# Patient Record
Sex: Female | Born: 1960 | Race: White | Hispanic: No | Marital: Married | Smoking: Current every day smoker
Health system: Southern US, Community
[De-identification: ages and names within clinical notes are randomized; demographics above are authoritative.]

---

## 2011-04-10 ENCOUNTER — Other Ambulatory Visit: Payer: Self-pay | Admitting: Orthopedic Surgery

## 2011-04-10 ENCOUNTER — Ambulatory Visit
Admission: RE | Admit: 2011-04-10 | Discharge: 2011-04-10 | Disposition: A | Discharge status: Home / Self Care | Payer: Medicaid Other | Source: Ambulatory Visit | Attending: Orthopedic Surgery | Admitting: Orthopedic Surgery

## 2011-04-10 DIAGNOSIS — M6740 Ganglion, unspecified site: Secondary | ICD-10-CM

## 2013-04-09 IMAGING — CR DG ELBOW 2V*R*
2 series · 2 of 2 positions shown · non-contrast
Comparison: None.

CLINICAL DATA: Right elbow pain

RIGHT ELBOW - 2 VIEW

[view not recorded (1 of 2)]
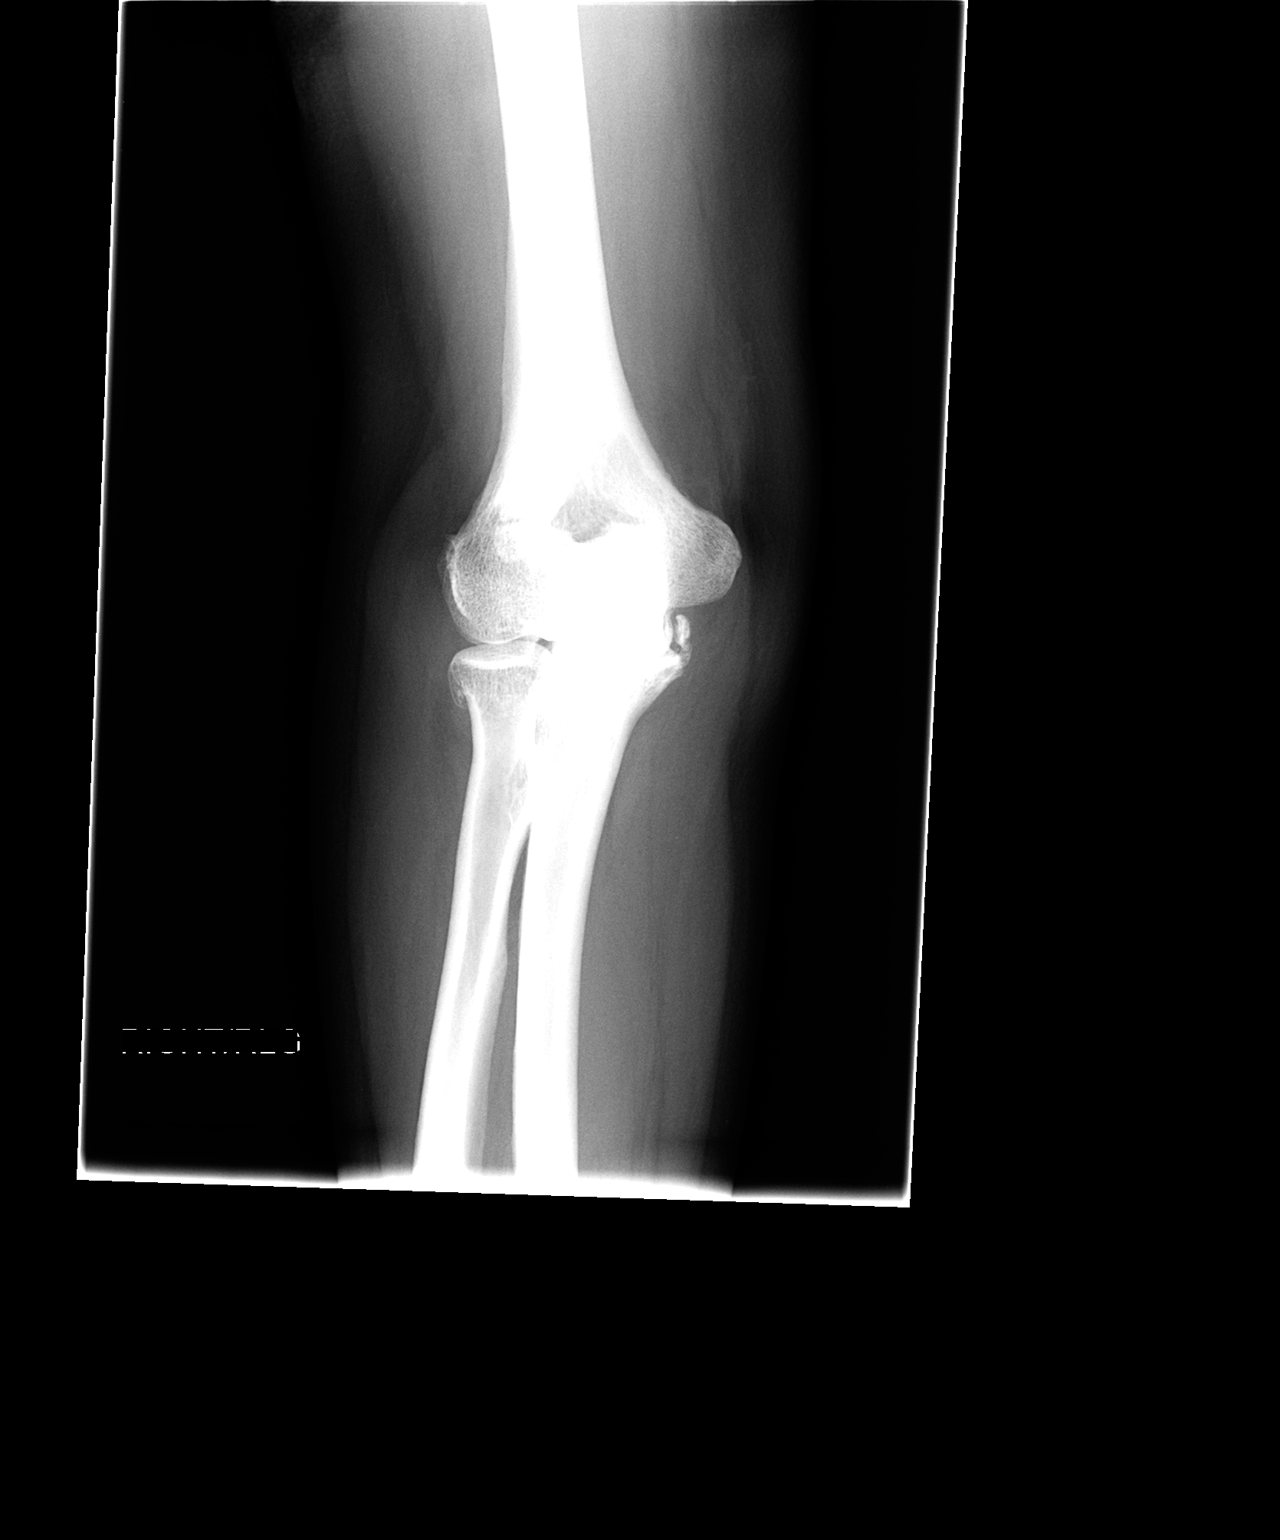

[view not recorded (2 of 2)]
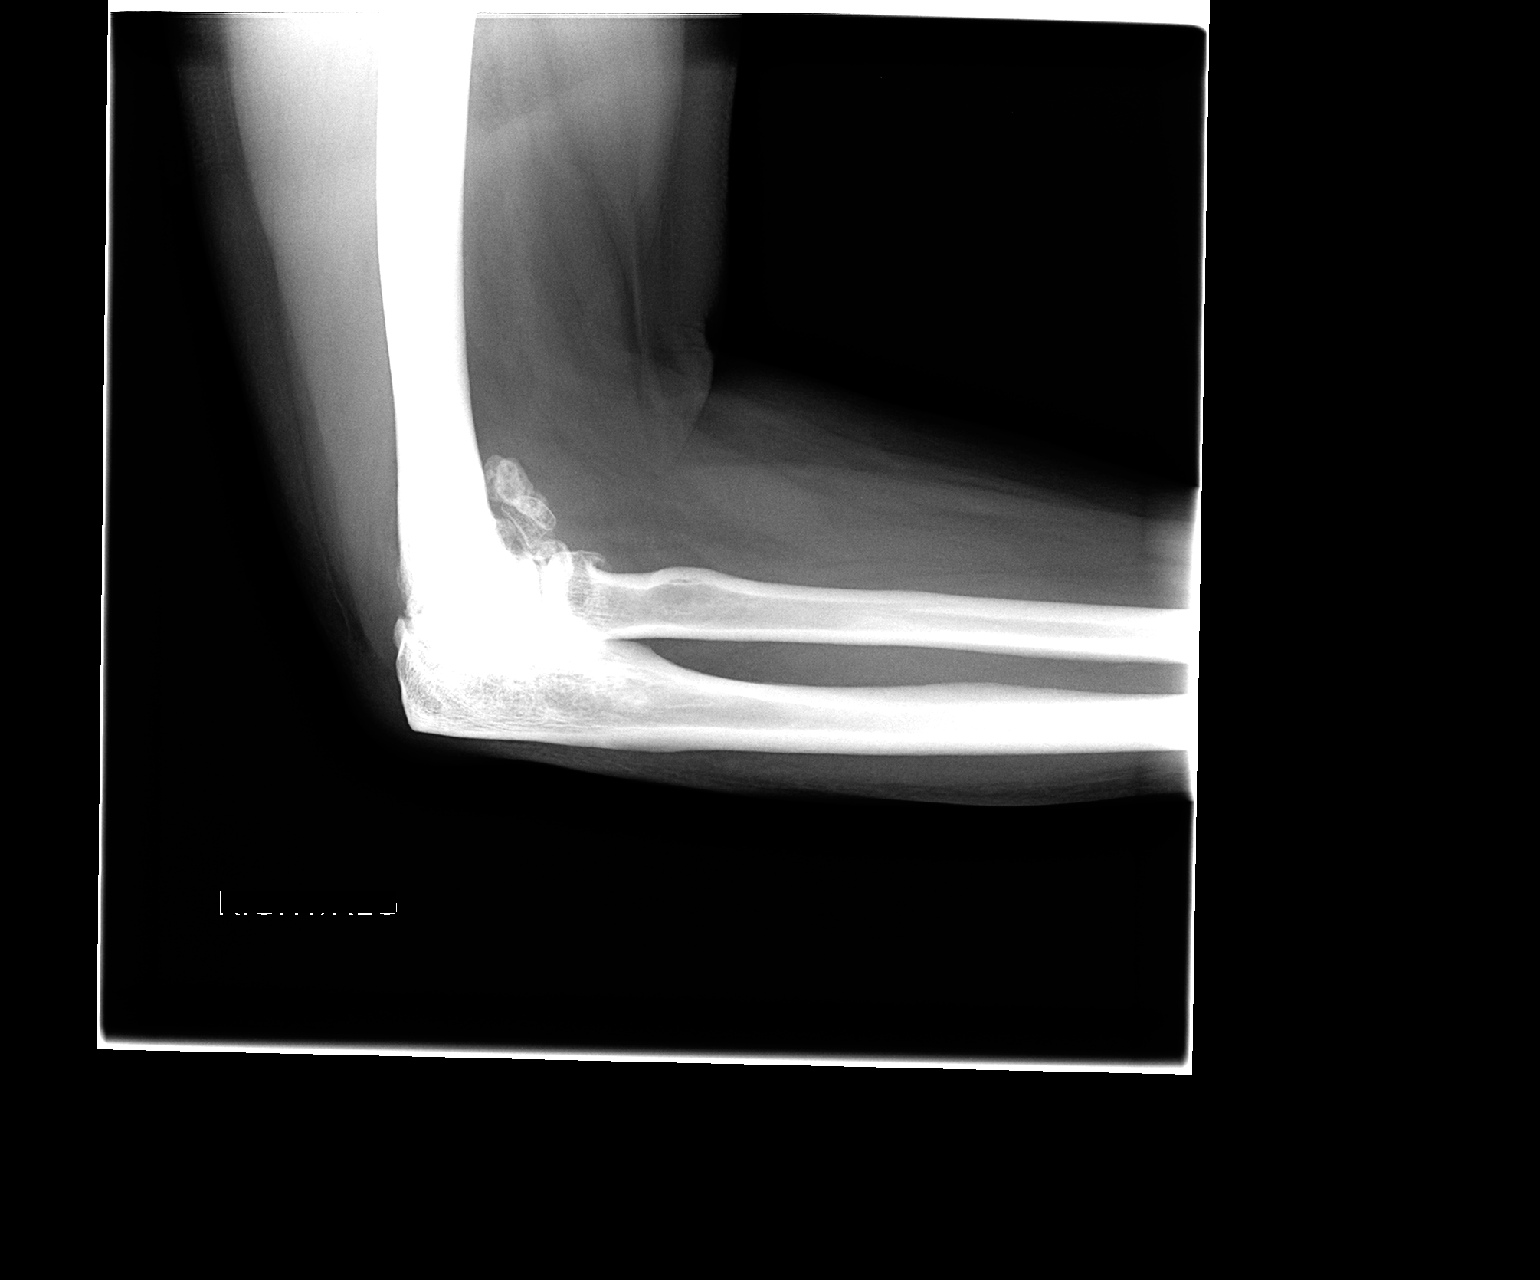

[2 of 2 positions shown; findings below may reference images not displayed]

FINDINGS: There is significant degenerative change involving the
right elbow with loss of joint space and sclerosis with spurring.
Also there appear to be loose bodies within the joint space
particularly anteriorly.  Alignment is normal.
IMPRESSION: Significant degenerative change with apparent loose bodies within
the right elbow joint space.

## 2013-04-09 IMAGING — CR DG WRIST 2V*L*
1 series · 1 of 1 positions shown · non-contrast
Comparison: None.

CLINICAL DATA: Left wrist pain

LEFT WRIST - 2 VIEW

[view not recorded]
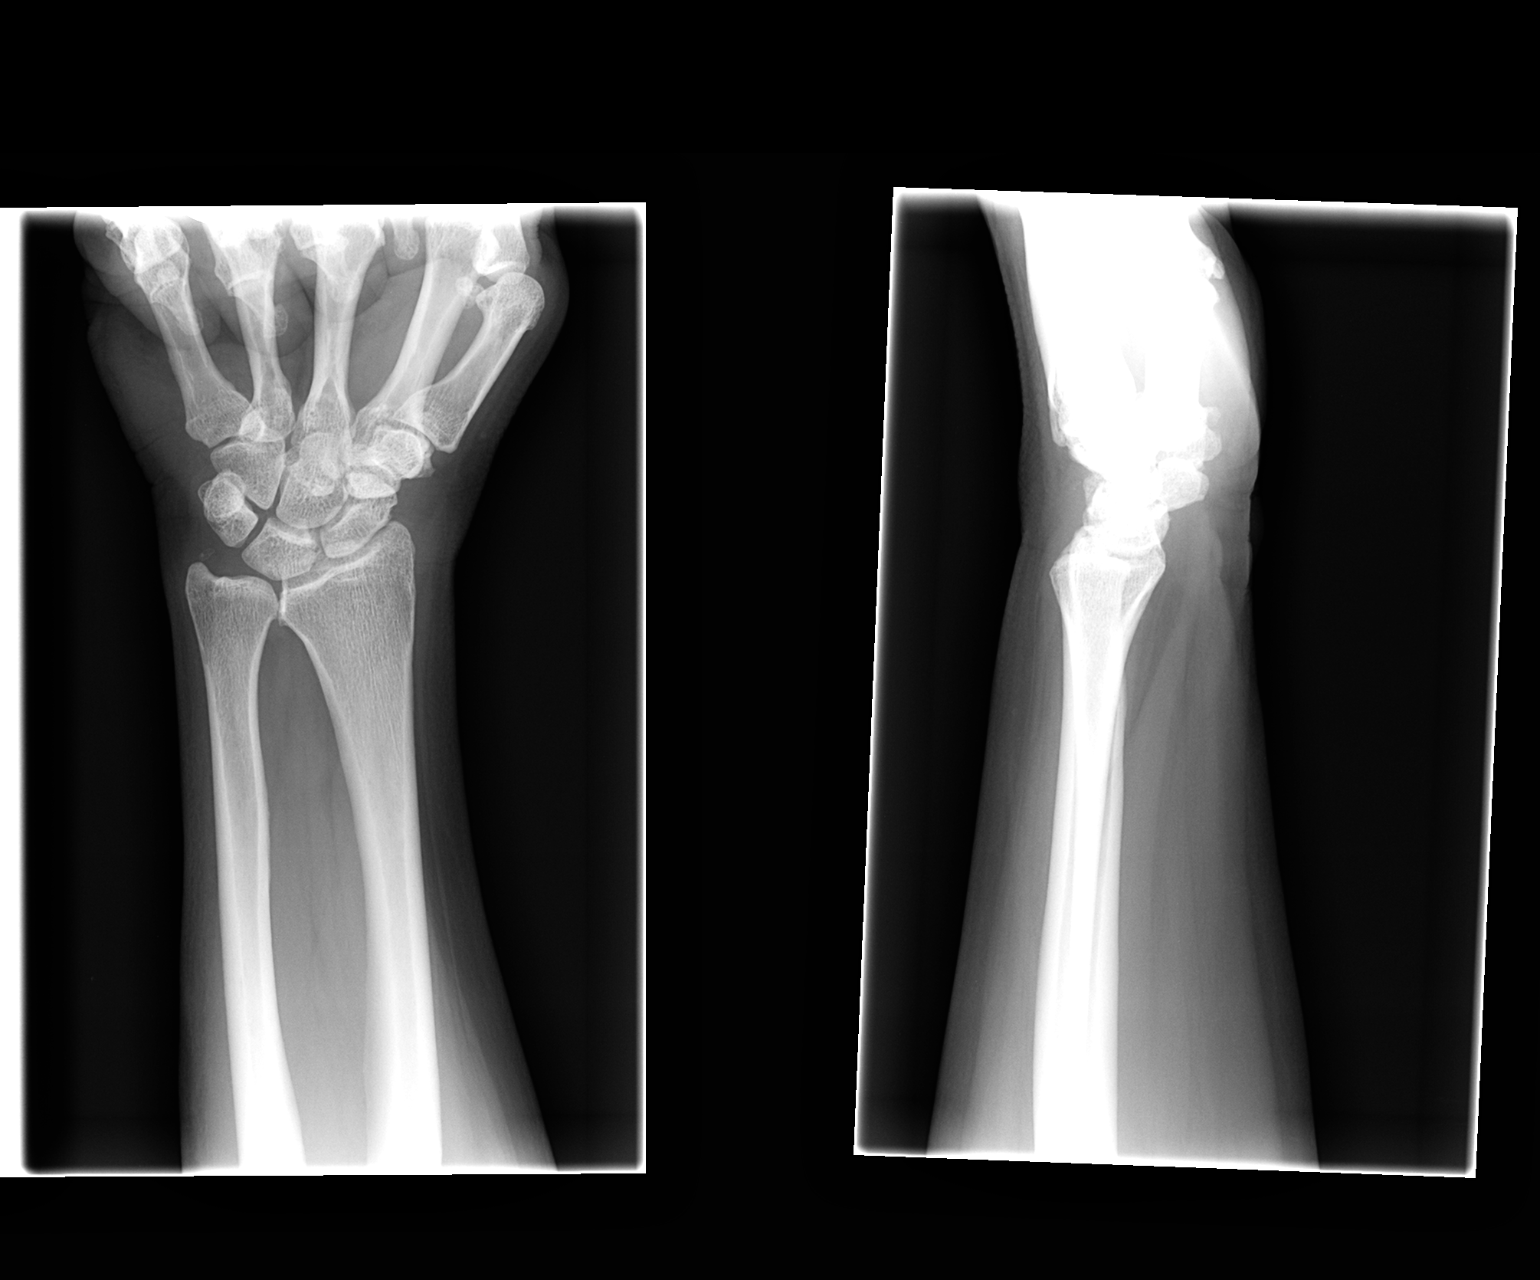

[1 of 1 positions shown; findings below may reference images not displayed]

FINDINGS: Radiocarpal joint space appears normal.  The carpal bones
are in normal position.  There is a very small bony density
adjacent to the ulnar styloid which may be related to prior trauma
but does not appear to represent an acute process.  Intercarpal
joint spaces are normal.  Alignment is normal.
IMPRESSION: No acute abnormality.

## 2014-11-05 DIAGNOSIS — E538 Deficiency of other specified B group vitamins: Secondary | ICD-10-CM | POA: Insufficient documentation

## 2014-11-05 DIAGNOSIS — E785 Hyperlipidemia, unspecified: Secondary | ICD-10-CM | POA: Insufficient documentation

## 2014-11-05 DIAGNOSIS — G8929 Other chronic pain: Secondary | ICD-10-CM | POA: Insufficient documentation

## 2014-11-05 DIAGNOSIS — I10 Essential (primary) hypertension: Secondary | ICD-10-CM | POA: Insufficient documentation

## 2014-11-05 DIAGNOSIS — M5441 Lumbago with sciatica, right side: Secondary | ICD-10-CM

## 2015-07-05 DIAGNOSIS — G959 Disease of spinal cord, unspecified: Secondary | ICD-10-CM | POA: Insufficient documentation

## 2016-04-14 DIAGNOSIS — F411 Generalized anxiety disorder: Secondary | ICD-10-CM | POA: Insufficient documentation

## 2016-04-14 DIAGNOSIS — F339 Major depressive disorder, recurrent, unspecified: Secondary | ICD-10-CM | POA: Insufficient documentation

## 2016-06-04 ENCOUNTER — Encounter (HOSPITAL_COMMUNITY): Payer: Self-pay | Admitting: Psychiatry

## 2016-06-04 ENCOUNTER — Ambulatory Visit (INDEPENDENT_AMBULATORY_CARE_PROVIDER_SITE_OTHER): Payer: Medicare HMO | Admitting: Psychiatry

## 2016-06-04 VITALS — BP 108/68 | HR 60 | Resp 16 | Ht 59.0 in | Wt 113.0 lb

## 2016-06-04 DIAGNOSIS — F0630 Mood disorder due to known physiological condition, unspecified: Secondary | ICD-10-CM

## 2016-06-04 DIAGNOSIS — F339 Major depressive disorder, recurrent, unspecified: Secondary | ICD-10-CM | POA: Diagnosis not present

## 2016-06-04 DIAGNOSIS — Z79899 Other long term (current) drug therapy: Secondary | ICD-10-CM | POA: Diagnosis not present

## 2016-06-04 DIAGNOSIS — F1290 Cannabis use, unspecified, uncomplicated: Secondary | ICD-10-CM

## 2016-06-04 DIAGNOSIS — F4310 Post-traumatic stress disorder, unspecified: Secondary | ICD-10-CM

## 2016-06-04 DIAGNOSIS — F17210 Nicotine dependence, cigarettes, uncomplicated: Secondary | ICD-10-CM | POA: Diagnosis not present

## 2016-06-04 DIAGNOSIS — F411 Generalized anxiety disorder: Secondary | ICD-10-CM | POA: Diagnosis not present

## 2016-06-04 MED ORDER — BUPROPION HCL ER (SR) 100 MG PO TB12: 100.0000 mg | ORAL_TABLET | Freq: Every day | ORAL | 0 refills | Status: AC
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
## 2016-06-29 ENCOUNTER — Ambulatory Visit (HOSPITAL_COMMUNITY): Payer: Self-pay | Admitting: Psychiatry

## 2016-07-27 ENCOUNTER — Ambulatory Visit (HOSPITAL_COMMUNITY): Payer: Self-pay | Admitting: Licensed Clinical Social Worker
